# Patient Record
Sex: Female | Born: 1975 | Race: White | Hispanic: No | Marital: Married | Smoking: Never smoker
Health system: Southern US, Community
[De-identification: ages and names within clinical notes are randomized; demographics above are authoritative.]

## PROBLEM LIST (undated history)

## (undated) DIAGNOSIS — I10 Essential (primary) hypertension: Secondary | ICD-10-CM

## (undated) HISTORY — PX: DILATION AND CURETTAGE OF UTERUS: SHX78

---

## 2013-04-23 DIAGNOSIS — J3089 Other allergic rhinitis: Secondary | ICD-10-CM | POA: Insufficient documentation

## 2013-04-23 DIAGNOSIS — F419 Anxiety disorder, unspecified: Secondary | ICD-10-CM | POA: Insufficient documentation

## 2013-04-23 DIAGNOSIS — I341 Nonrheumatic mitral (valve) prolapse: Secondary | ICD-10-CM | POA: Insufficient documentation

## 2013-04-23 DIAGNOSIS — E039 Hypothyroidism, unspecified: Secondary | ICD-10-CM | POA: Insufficient documentation

## 2015-03-15 DIAGNOSIS — J309 Allergic rhinitis, unspecified: Secondary | ICD-10-CM

## 2015-03-15 DIAGNOSIS — E039 Hypothyroidism, unspecified: Secondary | ICD-10-CM

## 2015-03-15 DIAGNOSIS — I341 Nonrheumatic mitral (valve) prolapse: Secondary | ICD-10-CM

## 2015-03-15 DIAGNOSIS — F419 Anxiety disorder, unspecified: Secondary | ICD-10-CM

## 2015-04-06 ENCOUNTER — Ambulatory Visit (INDEPENDENT_AMBULATORY_CARE_PROVIDER_SITE_OTHER): Payer: BC Managed Care – PPO | Admitting: *Deleted

## 2015-04-06 DIAGNOSIS — J309 Allergic rhinitis, unspecified: Secondary | ICD-10-CM

## 2015-04-13 ENCOUNTER — Other Ambulatory Visit: Payer: Self-pay | Admitting: Allergy

## 2015-04-13 MED ORDER — LEVOCETIRIZINE DIHYDROCHLORIDE 5 MG PO TABS: 5.0000 mg | ORAL_TABLET | Freq: Every day | ORAL | Status: DC

## 2015-04-17 ENCOUNTER — Ambulatory Visit (INDEPENDENT_AMBULATORY_CARE_PROVIDER_SITE_OTHER): Payer: BC Managed Care – PPO

## 2015-04-17 DIAGNOSIS — J309 Allergic rhinitis, unspecified: Secondary | ICD-10-CM | POA: Diagnosis not present

## 2015-04-24 ENCOUNTER — Ambulatory Visit (INDEPENDENT_AMBULATORY_CARE_PROVIDER_SITE_OTHER): Payer: BC Managed Care – PPO | Admitting: *Deleted

## 2015-04-24 DIAGNOSIS — J309 Allergic rhinitis, unspecified: Secondary | ICD-10-CM | POA: Diagnosis not present

## 2015-05-04 ENCOUNTER — Ambulatory Visit (INDEPENDENT_AMBULATORY_CARE_PROVIDER_SITE_OTHER): Payer: BC Managed Care – PPO | Admitting: *Deleted

## 2015-05-04 DIAGNOSIS — J309 Allergic rhinitis, unspecified: Secondary | ICD-10-CM

## 2015-05-15 ENCOUNTER — Ambulatory Visit (INDEPENDENT_AMBULATORY_CARE_PROVIDER_SITE_OTHER): Payer: BC Managed Care – PPO

## 2015-05-15 DIAGNOSIS — J309 Allergic rhinitis, unspecified: Secondary | ICD-10-CM | POA: Diagnosis not present

## 2015-05-29 ENCOUNTER — Ambulatory Visit (INDEPENDENT_AMBULATORY_CARE_PROVIDER_SITE_OTHER): Payer: BC Managed Care – PPO

## 2015-05-29 DIAGNOSIS — J309 Allergic rhinitis, unspecified: Secondary | ICD-10-CM

## 2015-06-12 ENCOUNTER — Ambulatory Visit (INDEPENDENT_AMBULATORY_CARE_PROVIDER_SITE_OTHER): Payer: BC Managed Care – PPO

## 2015-06-12 DIAGNOSIS — J309 Allergic rhinitis, unspecified: Secondary | ICD-10-CM | POA: Diagnosis not present

## 2015-06-15 ENCOUNTER — Other Ambulatory Visit: Payer: Self-pay

## 2015-06-15 MED ORDER — LEVOCETIRIZINE DIHYDROCHLORIDE 5 MG PO TABS: 5.0000 mg | ORAL_TABLET | Freq: Every day | ORAL | Status: DC

## 2015-06-15 MED ORDER — MONTELUKAST SODIUM 10 MG PO TABS: 10.0000 mg | ORAL_TABLET | Freq: Every day | ORAL | Status: DC

## 2015-06-20 ENCOUNTER — Other Ambulatory Visit: Payer: Self-pay | Admitting: Internal Medicine

## 2015-06-29 ENCOUNTER — Ambulatory Visit: Payer: Self-pay | Admitting: *Deleted

## 2015-06-29 DIAGNOSIS — J309 Allergic rhinitis, unspecified: Secondary | ICD-10-CM

## 2015-07-06 ENCOUNTER — Ambulatory Visit (INDEPENDENT_AMBULATORY_CARE_PROVIDER_SITE_OTHER): Payer: BC Managed Care – PPO | Admitting: *Deleted

## 2015-07-06 DIAGNOSIS — J309 Allergic rhinitis, unspecified: Secondary | ICD-10-CM | POA: Diagnosis not present

## 2015-07-13 ENCOUNTER — Ambulatory Visit (INDEPENDENT_AMBULATORY_CARE_PROVIDER_SITE_OTHER): Payer: BC Managed Care – PPO | Admitting: *Deleted

## 2015-07-13 DIAGNOSIS — J309 Allergic rhinitis, unspecified: Secondary | ICD-10-CM | POA: Diagnosis not present

## 2015-07-27 ENCOUNTER — Ambulatory Visit (INDEPENDENT_AMBULATORY_CARE_PROVIDER_SITE_OTHER): Payer: BC Managed Care – PPO | Admitting: *Deleted

## 2015-07-27 DIAGNOSIS — J309 Allergic rhinitis, unspecified: Secondary | ICD-10-CM

## 2015-07-27 DIAGNOSIS — J3089 Other allergic rhinitis: Secondary | ICD-10-CM | POA: Diagnosis not present

## 2015-07-28 DIAGNOSIS — J301 Allergic rhinitis due to pollen: Secondary | ICD-10-CM | POA: Diagnosis not present

## 2015-08-14 ENCOUNTER — Ambulatory Visit (INDEPENDENT_AMBULATORY_CARE_PROVIDER_SITE_OTHER): Payer: BC Managed Care – PPO | Admitting: *Deleted

## 2015-08-14 DIAGNOSIS — J309 Allergic rhinitis, unspecified: Secondary | ICD-10-CM

## 2015-08-21 ENCOUNTER — Ambulatory Visit (INDEPENDENT_AMBULATORY_CARE_PROVIDER_SITE_OTHER): Payer: BC Managed Care – PPO

## 2015-08-21 DIAGNOSIS — J309 Allergic rhinitis, unspecified: Secondary | ICD-10-CM | POA: Diagnosis not present

## 2015-09-04 ENCOUNTER — Ambulatory Visit (INDEPENDENT_AMBULATORY_CARE_PROVIDER_SITE_OTHER): Payer: BC Managed Care – PPO

## 2015-09-04 DIAGNOSIS — J309 Allergic rhinitis, unspecified: Secondary | ICD-10-CM | POA: Diagnosis not present

## 2015-09-19 ENCOUNTER — Other Ambulatory Visit: Payer: Self-pay | Admitting: Allergy

## 2015-09-19 MED ORDER — LEVOCETIRIZINE DIHYDROCHLORIDE 5 MG PO TABS: 5.0000 mg | ORAL_TABLET | Freq: Every day | ORAL | Status: DC

## 2015-09-20 ENCOUNTER — Ambulatory Visit (INDEPENDENT_AMBULATORY_CARE_PROVIDER_SITE_OTHER): Payer: BC Managed Care – PPO

## 2015-09-20 DIAGNOSIS — J309 Allergic rhinitis, unspecified: Secondary | ICD-10-CM

## 2015-10-05 ENCOUNTER — Ambulatory Visit (INDEPENDENT_AMBULATORY_CARE_PROVIDER_SITE_OTHER): Payer: BC Managed Care – PPO

## 2015-10-05 DIAGNOSIS — J309 Allergic rhinitis, unspecified: Secondary | ICD-10-CM

## 2015-10-06 ENCOUNTER — Ambulatory Visit: Payer: BC Managed Care – PPO | Admitting: Internal Medicine

## 2015-10-16 ENCOUNTER — Encounter: Payer: Self-pay | Admitting: Internal Medicine

## 2015-10-16 ENCOUNTER — Ambulatory Visit (INDEPENDENT_AMBULATORY_CARE_PROVIDER_SITE_OTHER): Payer: BC Managed Care – PPO | Admitting: Internal Medicine

## 2015-10-16 ENCOUNTER — Ambulatory Visit (INDEPENDENT_AMBULATORY_CARE_PROVIDER_SITE_OTHER): Payer: BC Managed Care – PPO

## 2015-10-16 VITALS — BP 130/84 | HR 80 | Temp 97.7°F | Resp 20 | Ht 63.78 in | Wt 221.6 lb

## 2015-10-16 DIAGNOSIS — J3089 Other allergic rhinitis: Secondary | ICD-10-CM | POA: Diagnosis not present

## 2015-10-16 DIAGNOSIS — J309 Allergic rhinitis, unspecified: Secondary | ICD-10-CM | POA: Diagnosis not present

## 2015-10-16 MED ORDER — KETOTIFEN FUMARATE 0.025 % OP SOLN: 1.0000 [drp] | Freq: Two times a day (BID) | OPHTHALMIC | Status: AC

## 2015-10-16 MED ORDER — FLUNISOLIDE 25 MCG/ACT (0.025%) NA SOLN: 1.0000 | Freq: Every day | NASAL | Status: DC

## 2015-10-16 NOTE — Progress Notes (Signed)
History of Present Illness: Grace Garza is a 40 y.o. female presenting for follow-up  HPI Comments: Allergic rhinitis on immunotherapy: start 06/21/2013. Maintenance reached April 2016. She has not had any shot reactions and is getting injections every 2 weeks. Last year, she had any increase in allergy symptoms of nasal congestion, sneezing, headaches, ocular pruritus. She has been on several rounds of antibiotics for sinus infections with only transient improvement. She denies any other types of infections. She did move to a new home in September. She is using Xyzal 500 daily, Singulair 10 mg daily, Patanase 1 spray nostril twice a day with breakthrough symptoms. In the past, an unidentified nasal spray caused epistaxis.   Current Outpatient Prescriptions on File Prior to Visit  Medication Sig Dispense Refill  . citalopram (CELEXA) 20 MG tablet Take 20 mg by mouth daily.    Marland Kitchen EPINEPHrine (EPIPEN 2-PAK) 0.3 mg/0.3 mL IJ SOAJ injection Inject 0.3 mg into the muscle as needed.     Marland Kitchen levocetirizine (XYZAL) 5 MG tablet Take 1 tablet (5 mg total) by mouth daily. 30 tablet 1  . levothyroxine (SYNTHROID, LEVOTHROID) 88 MCG tablet Take 112 mcg by mouth daily before breakfast.     . montelukast (SINGULAIR) 10 MG tablet Take 1 tablet (10 mg total) by mouth at bedtime. 30 tablet 5   No current facility-administered medications on file prior to visit.    Assessment and Plan: Allergic rhinitis  On immunotherapy, currently not well controlled  Continue Xyzal 5 mg daily, Singulair 10 mg daily  Stop Patanase and start flunisolide 1 spray each nostril daily. Be careful to avoid nasal septum to avoid nasal bleeds   Start ketotifen 1 drop each eye twice a day Given Prednisone 10 mg tablets. Take 1 tablet twice a day for 4 days, then 1 tablet on day #5. If not better, repeat allergy testing this summer to see if she is developing sensitivities and reformulate vaccine as necessary     Return in about 3  months (around 01/15/2016).  Meds ordered this encounter  Medications  . calcium carbonate (OSCAL) 1500 (600 Ca) MG TABS tablet    Sig: Take by mouth daily with breakfast.  . NON FORMULARY    Sig: ALLERGEN IMMUNOTHERAPY  . flunisolide (NASALIDE) 25 MCG/ACT (0.025%) SOLN    Sig: Place 1 spray into the nose daily.    Dispense:  25 mL    Refill:  5    For stuffy nose or drainage  . ketotifen (KP KETOTIFEN FUMARATE) 0.025 % ophthalmic solution    Sig: Place 1 drop into both eyes 2 (two) times daily.    Dispense:  10 mL    Refill:  5    For itchy eyes    Physical Exam: BP 130/84 mmHg  Pulse 80  Temp(Src) 97.7 F (36.5 C) (Oral)  Resp 20  Ht 5' 3.78" (1.62 m)  Wt 221 lb 9.6 oz (100.517 kg)  BMI 38.30 kg/m2   Physical Exam  Constitutional: She appears well-developed and well-nourished. No distress.  HENT:  Right Ear: External ear normal.  Left Ear: External ear normal.  Mouth/Throat: Oropharynx is clear and moist.  Nasal membranes edematous, erythematous with clear drainage  Eyes: Conjunctivae are normal. Right eye exhibits no discharge. Left eye exhibits no discharge.  Cardiovascular: Normal rate, regular rhythm and normal heart sounds.   No murmur heard. Pulmonary/Chest: Effort normal and breath sounds normal. No respiratory distress. She has no wheezes. She has no rales.  Abdominal: Soft.  Bowel sounds are normal.  Musculoskeletal: She exhibits no edema.  Lymphadenopathy:    She has no cervical adenopathy.  Neurological: She is alert.  Skin: No rash noted.  Vitals reviewed.   Drug Allergies:  Allergies  Allergen Reactions  . Avelox [Moxifloxacin] Nausea And Vomiting and Nausea Only    ROS: Per HPI unless specifically indicated below Review of Systems  Thank you for the opportunity to care for this patient.  Please do not hesitate to contact me with questions.

## 2015-10-16 NOTE — Assessment & Plan Note (Signed)
   On immunotherapy, currently not well controlled  Continue Xyzal 5 mg daily, Singulair 10 mg daily  Stop Patanase and start flunisolide 1 spray each nostril daily. Be careful to avoid nasal septum to avoid nasal bleeds   Start ketotifen 1 drop each eye twice a day Given Prednisone 10 mg tablets. Take 1 tablet twice a day for 4 days, then 1 tablet on day #5. If not better, repeat allergy testing this summer to see if she is developing sensitivities and reformulate vaccine as necessary

## 2015-10-16 NOTE — Patient Instructions (Signed)
Allergic rhinitis  On immunotherapy, currently not well controlled  Continue Xyzal 5 mg daily, Singulair 10 mg daily  Stop Patanase and start flunisolide 1 spray each nostril daily. Be careful to avoid nasal septum to avoid nasal bleeds   Start ketotifen 1 drop each eye twice a day Given Prednisone 10 mg tablets. Take 1 tablet twice a day for 4 days, then 1 tablet on day #5. If not better, repeat allergy testing this summer to see if she is developing sensitivities and reformulate vaccine as necessary

## 2015-10-23 ENCOUNTER — Other Ambulatory Visit: Payer: Self-pay

## 2015-10-23 MED ORDER — LEVOCETIRIZINE DIHYDROCHLORIDE 5 MG PO TABS: 5.0000 mg | ORAL_TABLET | Freq: Every day | ORAL | Status: DC

## 2015-11-06 ENCOUNTER — Ambulatory Visit (INDEPENDENT_AMBULATORY_CARE_PROVIDER_SITE_OTHER): Payer: BC Managed Care – PPO

## 2015-11-06 DIAGNOSIS — J309 Allergic rhinitis, unspecified: Secondary | ICD-10-CM

## 2015-11-13 ENCOUNTER — Ambulatory Visit (INDEPENDENT_AMBULATORY_CARE_PROVIDER_SITE_OTHER): Payer: BC Managed Care – PPO

## 2015-11-13 DIAGNOSIS — J309 Allergic rhinitis, unspecified: Secondary | ICD-10-CM | POA: Diagnosis not present

## 2015-11-29 ENCOUNTER — Ambulatory Visit (INDEPENDENT_AMBULATORY_CARE_PROVIDER_SITE_OTHER): Payer: BC Managed Care – PPO

## 2015-11-29 DIAGNOSIS — J309 Allergic rhinitis, unspecified: Secondary | ICD-10-CM | POA: Diagnosis not present

## 2015-12-11 ENCOUNTER — Ambulatory Visit (INDEPENDENT_AMBULATORY_CARE_PROVIDER_SITE_OTHER): Payer: BC Managed Care – PPO

## 2015-12-11 DIAGNOSIS — J309 Allergic rhinitis, unspecified: Secondary | ICD-10-CM | POA: Diagnosis not present

## 2015-12-25 ENCOUNTER — Other Ambulatory Visit: Payer: Self-pay | Admitting: Allergy

## 2015-12-25 MED ORDER — MONTELUKAST SODIUM 10 MG PO TABS: 10.0000 mg | ORAL_TABLET | Freq: Every day | ORAL | Status: DC

## 2015-12-28 ENCOUNTER — Ambulatory Visit (INDEPENDENT_AMBULATORY_CARE_PROVIDER_SITE_OTHER): Payer: BC Managed Care – PPO

## 2015-12-28 DIAGNOSIS — J309 Allergic rhinitis, unspecified: Secondary | ICD-10-CM | POA: Diagnosis not present

## 2016-01-01 DIAGNOSIS — J3089 Other allergic rhinitis: Secondary | ICD-10-CM | POA: Diagnosis not present

## 2016-01-02 DIAGNOSIS — J301 Allergic rhinitis due to pollen: Secondary | ICD-10-CM | POA: Diagnosis not present

## 2016-01-11 ENCOUNTER — Ambulatory Visit (INDEPENDENT_AMBULATORY_CARE_PROVIDER_SITE_OTHER): Payer: BC Managed Care – PPO | Admitting: *Deleted

## 2016-01-11 DIAGNOSIS — J309 Allergic rhinitis, unspecified: Secondary | ICD-10-CM | POA: Diagnosis not present

## 2016-01-25 ENCOUNTER — Ambulatory Visit (INDEPENDENT_AMBULATORY_CARE_PROVIDER_SITE_OTHER): Payer: BC Managed Care – PPO | Admitting: *Deleted

## 2016-01-25 DIAGNOSIS — J309 Allergic rhinitis, unspecified: Secondary | ICD-10-CM

## 2016-02-01 ENCOUNTER — Ambulatory Visit (INDEPENDENT_AMBULATORY_CARE_PROVIDER_SITE_OTHER): Payer: BC Managed Care – PPO

## 2016-02-01 DIAGNOSIS — J309 Allergic rhinitis, unspecified: Secondary | ICD-10-CM

## 2016-02-12 ENCOUNTER — Ambulatory Visit (INDEPENDENT_AMBULATORY_CARE_PROVIDER_SITE_OTHER): Payer: BC Managed Care – PPO

## 2016-02-12 DIAGNOSIS — J309 Allergic rhinitis, unspecified: Secondary | ICD-10-CM

## 2016-02-21 ENCOUNTER — Ambulatory Visit (INDEPENDENT_AMBULATORY_CARE_PROVIDER_SITE_OTHER): Payer: BC Managed Care – PPO

## 2016-02-21 DIAGNOSIS — J309 Allergic rhinitis, unspecified: Secondary | ICD-10-CM

## 2016-02-28 ENCOUNTER — Ambulatory Visit (INDEPENDENT_AMBULATORY_CARE_PROVIDER_SITE_OTHER): Payer: BC Managed Care – PPO

## 2016-02-28 DIAGNOSIS — J309 Allergic rhinitis, unspecified: Secondary | ICD-10-CM | POA: Diagnosis not present

## 2016-03-14 ENCOUNTER — Ambulatory Visit (INDEPENDENT_AMBULATORY_CARE_PROVIDER_SITE_OTHER): Payer: BC Managed Care – PPO

## 2016-03-14 DIAGNOSIS — J309 Allergic rhinitis, unspecified: Secondary | ICD-10-CM | POA: Diagnosis not present

## 2016-03-26 ENCOUNTER — Ambulatory Visit (INDEPENDENT_AMBULATORY_CARE_PROVIDER_SITE_OTHER): Payer: BC Managed Care – PPO

## 2016-03-26 DIAGNOSIS — J309 Allergic rhinitis, unspecified: Secondary | ICD-10-CM | POA: Diagnosis not present

## 2016-04-09 ENCOUNTER — Ambulatory Visit (INDEPENDENT_AMBULATORY_CARE_PROVIDER_SITE_OTHER): Payer: BC Managed Care – PPO

## 2016-04-09 DIAGNOSIS — J309 Allergic rhinitis, unspecified: Secondary | ICD-10-CM | POA: Diagnosis not present

## 2016-04-23 ENCOUNTER — Ambulatory Visit (INDEPENDENT_AMBULATORY_CARE_PROVIDER_SITE_OTHER): Payer: BC Managed Care – PPO

## 2016-04-23 DIAGNOSIS — J309 Allergic rhinitis, unspecified: Secondary | ICD-10-CM | POA: Diagnosis not present

## 2016-04-24 ENCOUNTER — Other Ambulatory Visit: Payer: Self-pay | Admitting: Allergy

## 2016-04-24 MED ORDER — LEVOCETIRIZINE DIHYDROCHLORIDE 5 MG PO TABS: 5.0000 mg | ORAL_TABLET | Freq: Every day | ORAL | 5 refills | Status: DC

## 2016-04-25 DIAGNOSIS — J301 Allergic rhinitis due to pollen: Secondary | ICD-10-CM | POA: Diagnosis not present

## 2016-04-26 DIAGNOSIS — J3089 Other allergic rhinitis: Secondary | ICD-10-CM | POA: Diagnosis not present

## 2016-05-13 ENCOUNTER — Ambulatory Visit (INDEPENDENT_AMBULATORY_CARE_PROVIDER_SITE_OTHER): Payer: BC Managed Care – PPO

## 2016-05-13 DIAGNOSIS — J309 Allergic rhinitis, unspecified: Secondary | ICD-10-CM

## 2016-05-27 ENCOUNTER — Ambulatory Visit (INDEPENDENT_AMBULATORY_CARE_PROVIDER_SITE_OTHER): Payer: BC Managed Care – PPO

## 2016-05-27 DIAGNOSIS — J309 Allergic rhinitis, unspecified: Secondary | ICD-10-CM

## 2016-06-05 ENCOUNTER — Ambulatory Visit (INDEPENDENT_AMBULATORY_CARE_PROVIDER_SITE_OTHER): Payer: BC Managed Care – PPO | Admitting: *Deleted

## 2016-06-05 DIAGNOSIS — J309 Allergic rhinitis, unspecified: Secondary | ICD-10-CM

## 2016-06-12 ENCOUNTER — Ambulatory Visit (INDEPENDENT_AMBULATORY_CARE_PROVIDER_SITE_OTHER): Payer: BC Managed Care – PPO | Admitting: *Deleted

## 2016-06-12 DIAGNOSIS — J309 Allergic rhinitis, unspecified: Secondary | ICD-10-CM | POA: Diagnosis not present

## 2016-06-19 ENCOUNTER — Ambulatory Visit (INDEPENDENT_AMBULATORY_CARE_PROVIDER_SITE_OTHER): Payer: BC Managed Care – PPO | Admitting: *Deleted

## 2016-06-19 DIAGNOSIS — J309 Allergic rhinitis, unspecified: Secondary | ICD-10-CM

## 2016-06-26 ENCOUNTER — Ambulatory Visit (INDEPENDENT_AMBULATORY_CARE_PROVIDER_SITE_OTHER): Payer: BC Managed Care – PPO | Admitting: *Deleted

## 2016-06-26 DIAGNOSIS — J309 Allergic rhinitis, unspecified: Secondary | ICD-10-CM | POA: Diagnosis not present

## 2016-07-11 ENCOUNTER — Ambulatory Visit (INDEPENDENT_AMBULATORY_CARE_PROVIDER_SITE_OTHER): Payer: BC Managed Care – PPO

## 2016-07-11 DIAGNOSIS — J309 Allergic rhinitis, unspecified: Secondary | ICD-10-CM | POA: Diagnosis not present

## 2016-07-23 ENCOUNTER — Other Ambulatory Visit: Payer: Self-pay | Admitting: Allergy

## 2016-07-23 MED ORDER — MONTELUKAST SODIUM 10 MG PO TABS: 10.0000 mg | ORAL_TABLET | Freq: Every day | ORAL | 5 refills | Status: DC

## 2016-07-29 ENCOUNTER — Ambulatory Visit (INDEPENDENT_AMBULATORY_CARE_PROVIDER_SITE_OTHER): Payer: BC Managed Care – PPO

## 2016-07-29 DIAGNOSIS — J309 Allergic rhinitis, unspecified: Secondary | ICD-10-CM | POA: Diagnosis not present

## 2016-08-09 ENCOUNTER — Encounter: Payer: Self-pay | Admitting: *Deleted

## 2016-08-09 NOTE — Progress Notes (Signed)
2 maintenance vials made. Exp: 08/09/17. hc

## 2016-08-16 ENCOUNTER — Ambulatory Visit (INDEPENDENT_AMBULATORY_CARE_PROVIDER_SITE_OTHER): Payer: BC Managed Care – PPO

## 2016-08-16 DIAGNOSIS — J309 Allergic rhinitis, unspecified: Secondary | ICD-10-CM | POA: Diagnosis not present

## 2016-08-28 DIAGNOSIS — J3089 Other allergic rhinitis: Secondary | ICD-10-CM | POA: Diagnosis not present

## 2016-08-29 ENCOUNTER — Ambulatory Visit (INDEPENDENT_AMBULATORY_CARE_PROVIDER_SITE_OTHER): Payer: BC Managed Care – PPO

## 2016-08-29 DIAGNOSIS — J309 Allergic rhinitis, unspecified: Secondary | ICD-10-CM | POA: Diagnosis not present

## 2016-08-30 DIAGNOSIS — J301 Allergic rhinitis due to pollen: Secondary | ICD-10-CM | POA: Diagnosis not present

## 2016-09-10 ENCOUNTER — Ambulatory Visit (INDEPENDENT_AMBULATORY_CARE_PROVIDER_SITE_OTHER): Payer: BC Managed Care – PPO

## 2016-09-10 DIAGNOSIS — J309 Allergic rhinitis, unspecified: Secondary | ICD-10-CM

## 2016-09-10 NOTE — Addendum Note (Signed)
Addended by: Berna BueWHITAKER, CARRIE L on: 09/10/2016 09:38 AM   Modules accepted: Orders

## 2016-09-25 ENCOUNTER — Ambulatory Visit (INDEPENDENT_AMBULATORY_CARE_PROVIDER_SITE_OTHER): Payer: BC Managed Care – PPO

## 2016-09-25 DIAGNOSIS — J309 Allergic rhinitis, unspecified: Secondary | ICD-10-CM

## 2016-10-10 ENCOUNTER — Ambulatory Visit (INDEPENDENT_AMBULATORY_CARE_PROVIDER_SITE_OTHER): Payer: BC Managed Care – PPO

## 2016-10-10 DIAGNOSIS — J309 Allergic rhinitis, unspecified: Secondary | ICD-10-CM | POA: Diagnosis not present

## 2016-10-16 ENCOUNTER — Ambulatory Visit (INDEPENDENT_AMBULATORY_CARE_PROVIDER_SITE_OTHER): Payer: BC Managed Care – PPO

## 2016-10-16 DIAGNOSIS — J309 Allergic rhinitis, unspecified: Secondary | ICD-10-CM | POA: Diagnosis not present

## 2016-10-24 ENCOUNTER — Other Ambulatory Visit: Payer: Self-pay

## 2016-10-25 ENCOUNTER — Ambulatory Visit (INDEPENDENT_AMBULATORY_CARE_PROVIDER_SITE_OTHER): Payer: BC Managed Care – PPO

## 2016-10-25 DIAGNOSIS — J309 Allergic rhinitis, unspecified: Secondary | ICD-10-CM | POA: Diagnosis not present

## 2016-10-31 ENCOUNTER — Ambulatory Visit (INDEPENDENT_AMBULATORY_CARE_PROVIDER_SITE_OTHER): Payer: BC Managed Care – PPO

## 2016-10-31 DIAGNOSIS — J309 Allergic rhinitis, unspecified: Secondary | ICD-10-CM

## 2016-11-07 ENCOUNTER — Ambulatory Visit (INDEPENDENT_AMBULATORY_CARE_PROVIDER_SITE_OTHER): Payer: BC Managed Care – PPO

## 2016-11-07 DIAGNOSIS — J309 Allergic rhinitis, unspecified: Secondary | ICD-10-CM | POA: Diagnosis not present

## 2016-11-20 ENCOUNTER — Other Ambulatory Visit: Payer: Self-pay

## 2016-11-20 NOTE — Telephone Encounter (Signed)
Denied refill xyzal.  Patient needs ov.  Last ov 10/16/15.

## 2016-11-25 ENCOUNTER — Ambulatory Visit (INDEPENDENT_AMBULATORY_CARE_PROVIDER_SITE_OTHER): Payer: BC Managed Care – PPO

## 2016-11-25 DIAGNOSIS — J309 Allergic rhinitis, unspecified: Secondary | ICD-10-CM

## 2016-11-28 ENCOUNTER — Emergency Department (HOSPITAL_BASED_OUTPATIENT_CLINIC_OR_DEPARTMENT_OTHER)
Admission: EM | Admit: 2016-11-28 | Discharge: 2016-11-29 | Disposition: A | Discharge status: Home / Self Care | Payer: BC Managed Care – PPO | Attending: Emergency Medicine | Admitting: Emergency Medicine

## 2016-11-28 ENCOUNTER — Encounter (HOSPITAL_BASED_OUTPATIENT_CLINIC_OR_DEPARTMENT_OTHER): Payer: Self-pay | Admitting: *Deleted

## 2016-11-28 ENCOUNTER — Emergency Department (HOSPITAL_BASED_OUTPATIENT_CLINIC_OR_DEPARTMENT_OTHER): Payer: BC Managed Care – PPO

## 2016-11-28 DIAGNOSIS — Z79899 Other long term (current) drug therapy: Secondary | ICD-10-CM | POA: Diagnosis not present

## 2016-11-28 DIAGNOSIS — K55069 Acute infarction of intestine, part and extent unspecified: Secondary | ICD-10-CM | POA: Insufficient documentation

## 2016-11-28 DIAGNOSIS — I10 Essential (primary) hypertension: Secondary | ICD-10-CM | POA: Insufficient documentation

## 2016-11-28 DIAGNOSIS — R1013 Epigastric pain: Secondary | ICD-10-CM

## 2016-11-28 HISTORY — DX: Essential (primary) hypertension: I10

## 2016-11-28 LAB — CBC WITH DIFFERENTIAL/PLATELET
BASOS PCT: 0 %
Basophils Absolute: 0.1 10*3/uL (ref 0.0–0.1)
EOS ABS: 0.2 10*3/uL (ref 0.0–0.7)
Eosinophils Relative: 1 %
HCT: 39.6 % (ref 36.0–46.0)
HEMOGLOBIN: 13.2 g/dL (ref 12.0–15.0)
LYMPHS ABS: 2.5 10*3/uL (ref 0.7–4.0)
Lymphocytes Relative: 18 %
MCH: 28.5 pg (ref 26.0–34.0)
MCHC: 33.3 g/dL (ref 30.0–36.0)
MCV: 85.5 fL (ref 78.0–100.0)
MONOS PCT: 9 %
Monocytes Absolute: 1.2 10*3/uL — ABNORMAL HIGH (ref 0.1–1.0)
NEUTROS ABS: 10.1 10*3/uL — AB (ref 1.7–7.7)
NEUTROS PCT: 72 %
Platelets: 216 10*3/uL (ref 150–400)
RBC: 4.63 MIL/uL (ref 3.87–5.11)
RDW: 13.4 % (ref 11.5–15.5)
WBC: 14 10*3/uL — ABNORMAL HIGH (ref 4.0–10.5)

## 2016-11-28 LAB — COMPREHENSIVE METABOLIC PANEL
ALBUMIN: 3.7 g/dL (ref 3.5–5.0)
ALT: 17 U/L (ref 14–54)
ANION GAP: 8 (ref 5–15)
AST: 19 U/L (ref 15–41)
Alkaline Phosphatase: 90 U/L (ref 38–126)
BUN: 10 mg/dL (ref 6–20)
CO2: 23 mmol/L (ref 22–32)
Calcium: 8.7 mg/dL — ABNORMAL LOW (ref 8.9–10.3)
Chloride: 103 mmol/L (ref 101–111)
Creatinine, Ser: 1.05 mg/dL — ABNORMAL HIGH (ref 0.44–1.00)
GFR calc non Af Amer: >60 mL/min (ref >60)
GLUCOSE: 98 mg/dL (ref 65–99)
Potassium: 3.9 mmol/L (ref 3.5–5.1)
SODIUM: 134 mmol/L — AB (ref 135–145)
TOTAL PROTEIN: 7.3 g/dL (ref 6.5–8.1)
Total Bilirubin: 0.8 mg/dL (ref 0.3–1.2)

## 2016-11-28 LAB — URINALYSIS, ROUTINE W REFLEX MICROSCOPIC
BILIRUBIN URINE: NEGATIVE
GLUCOSE, UA: NEGATIVE mg/dL
HGB URINE DIPSTICK: NEGATIVE
KETONES UR: NEGATIVE mg/dL
Leukocytes, UA: NEGATIVE
Nitrite: NEGATIVE
PROTEIN: NEGATIVE mg/dL
Specific Gravity, Urine: 1.013 (ref 1.005–1.030)
pH: 7 (ref 5.0–8.0)

## 2016-11-28 LAB — PREGNANCY, URINE: PREG TEST UR: NEGATIVE

## 2016-11-28 LAB — LIPASE, BLOOD: Lipase: 23 U/L (ref 11–51)

## 2016-11-28 MED ORDER — AMOXICILLIN-POT CLAVULANATE 875-125 MG PO TABS
1.0000 | ORAL_TABLET | Freq: Once | ORAL | Status: AC
Start: 2016-11-29 — End: 2016-11-29
  Administered 2016-11-29: 1 via ORAL
  Filled 2016-11-28: qty 1

## 2016-11-28 MED ORDER — PIPERACILLIN-TAZOBACTAM 3.375 G IVPB 30 MIN
3.3750 g | Freq: Once | INTRAVENOUS | Status: DC
  Administered 2016-11-28: 3.375 g via INTRAVENOUS
  Filled 2016-11-28 (×2): qty 50

## 2016-11-28 MED ORDER — SODIUM CHLORIDE 0.9 % IV BOLUS (SEPSIS)
1000.0000 mL | Freq: Once | INTRAVENOUS | Status: AC
  Administered 2016-11-28: 1000 mL via INTRAVENOUS

## 2016-11-28 MED ORDER — GI COCKTAIL ~~LOC~~
30.0000 mL | Freq: Once | ORAL | Status: AC
  Administered 2016-11-28: 30 mL via ORAL
  Filled 2016-11-28: qty 30

## 2016-11-28 MED ORDER — IOPAMIDOL (ISOVUE-300) INJECTION 61%
100.0000 mL | Freq: Once | INTRAVENOUS | Status: AC | PRN
  Administered 2016-11-28: 100 mL via INTRAVENOUS

## 2016-11-28 MED ORDER — VANCOMYCIN HCL IN DEXTROSE 1-5 GM/200ML-% IV SOLN: 1000.0000 mg | Freq: Once | INTRAVENOUS | Status: DC

## 2016-11-28 MED ORDER — TRAMADOL HCL 50 MG PO TABS
50.0000 mg | ORAL_TABLET | Freq: Once | ORAL | Status: AC
  Administered 2016-11-29: 50 mg via ORAL
  Filled 2016-11-28: qty 1

## 2016-11-28 MED ORDER — PANTOPRAZOLE SODIUM 40 MG PO TBEC
40.0000 mg | DELAYED_RELEASE_TABLET | Freq: Once | ORAL | Status: AC
  Administered 2016-11-29: 40 mg via ORAL
  Filled 2016-11-28: qty 1

## 2016-11-28 MED ORDER — SODIUM CHLORIDE 0.9 % IV BOLUS (SEPSIS)
500.0000 mL | Freq: Once | INTRAVENOUS | Status: AC
  Administered 2016-11-28: 500 mL via INTRAVENOUS

## 2016-11-28 NOTE — ED Notes (Signed)
Patient transported to CT 

## 2016-11-28 NOTE — ED Provider Notes (Signed)
[facility]   CSN: [id number] Arrival date & time: 11/28/16  2105  By signing my name below, I, Grace Garza, attest that this documentation has been prepared under the direction and in the presence of Benjiman CorePickering, Maurio Baize, MD. Electronically Signed: Diona BrownerJennifer Garza, ED Scribe. 11/28/16. 9:58 PM.  History   Chief Complaint Chief Complaint  Patient presents with  . Abdominal Pain    HPI Grace [place] LionsLeslie Garza is Garza 41 y.o. female who presents to the Emergency Department complaining of gradually worsening, moderate, abdominal pain that started ~ 2 days ago. Associated sx include fever and nausea. Pt notes talking 2 alka seltzer tabs with mild relief. She was seen by PCP at 3 pm and had labs done and was ordered an US for tomorrow. Pt denies chills and vomiting.  PCP: Grace Garza, Grace Anthony, MD  The history is provided by the patient. No language interpreter was used.    Past Medical History:  Diagnosis Date  . Hypertension     Patient Active Problem List   Diagnosis Date Noted  . Allergic rhinitis 04/23/2013  . Hypothyroidism 04/23/2013  . Anxiety 04/23/2013  . Mitral valve prolapse 04/23/2013    Past Surgical History:  Procedure Laterality Date  . DILATION AND CURETTAGE OF UTERUS      OB History    No data available       Home Medications    Prior to Admission medications   Medication Sig Start Date End Date Taking? Authorizing Provider  lisinopril (PRINIVIL,ZESTRIL) 5 MG tablet Take 5 mg by mouth daily.   Yes [provider]  calcium carbonate (OSCAL) 1500 (600 Ca) MG TABS tablet Take by mouth daily with breakfast.    [provider]  citalopram (CELEXA) 20 MG tablet Take 20 mg by mouth daily. 04/21/13   [provider]  EPINEPHrine (EPIPEN 2-PAK) 0.3 mg/0.3 mL IJ SOAJ injection Inject 0.3 mg into the muscle as needed.  04/23/13   [provider]  flunisolide (NASALIDE) 25 MCG/ACT (0.025%) SOLN Place 1 spray  into the nose daily. 10/16/15   Grace Garza, Sokun, MD  ketotifen (KP KETOTIFEN FUMARATE) 0.025 % ophthalmic solution Place 1 drop into both eyes 2 (two) times daily. 10/16/15   Grace Garza, Sokun, MD  levocetirizine (XYZAL) 5 MG tablet Take 1 tablet (5 mg total) by mouth daily. 04/24/16   Bobbitt, Grace Ilesalph Carter, MD  levothyroxine (SYNTHROID, LEVOTHROID) 88 MCG tablet Take 112 mcg by mouth daily before breakfast.     [provider]  montelukast (SINGULAIR) 10 MG tablet Take 1 tablet (10 mg total) by mouth at bedtime. 07/23/16   Fletcher AnonBardelas, Grace A, MD  NON FORMULARY ALLERGEN IMMUNOTHERAPY    [provider]    Family History History reviewed. No pertinent family history.  Social History Social History  Substance Use Topics  . Smoking status: Unknown If Ever Smoked  . Smokeless tobacco: Never Used  . Alcohol use No     Allergies   Avelox [moxifloxacin]   Review of Systems Review of Systems  Constitutional: Positive for fever. Negative for chills.  Gastrointestinal: Positive for abdominal pain and nausea. Negative for vomiting.     Physical Exam Updated Vital Signs BP 137/66   Pulse 95   Temp 99 F (37.2 C) (Oral)   Resp 16   Ht 5\' 4"  (1.626 m)   Wt 104.3 kg (230 lb)   SpO2 99%   BMI 39.48 kg/m   Physical Exam  Constitutional: She is oriented to person,  place, and time. She appears well-developed and well-nourished.  HENT:  Head: Normocephalic.  Mouth/Throat: Oropharynx is clear and moist.  Eyes: Conjunctivae and EOM are normal. Pupils are equal, round, and reactive to light.  Neck: Normal range of motion.  Cardiovascular: Normal rate, regular rhythm, normal heart sounds and intact distal pulses.   Pulmonary/Chest: Effort normal.  Abdominal: She exhibits no distension. There is tenderness.  Epigastric tenderness. Moderate fullness.  No RUQ tenderness.   Musculoskeletal: Normal range of motion.  Neurological: She is alert and oriented to person, place, and  time. No cranial nerve deficit or sensory deficit. She exhibits normal muscle tone. Coordination normal.  Psychiatric: She has Garza normal mood and affect.  Nursing note and vitals reviewed.    ED Treatments / Results  COORDINATION OF CARE: 9:58 PM-Discussed next steps with pt. Pt verbalized understanding and is agreeable with the plan.  Results for orders placed or performed during the hospital encounter of 11/28/16  Pregnancy, urine  Result Value Ref Range   Preg Test, Ur NEGATIVE NEGATIVE  Urinalysis, Routine w reflex microscopic  Result Value Ref Range   Color, Urine YELLOW YELLOW   APPearance CLEAR CLEAR   Specific Gravity, Urine 1.013 1.005 - 1.030   pH 7.0 5.0 - 8.0   Glucose, UA NEGATIVE NEGATIVE mg/dL   Hgb urine dipstick NEGATIVE NEGATIVE   Bilirubin Urine NEGATIVE NEGATIVE   Ketones, ur NEGATIVE NEGATIVE mg/dL   Protein, ur NEGATIVE NEGATIVE mg/dL   Nitrite NEGATIVE NEGATIVE   Leukocytes, UA NEGATIVE NEGATIVE  Comprehensive metabolic panel  Result Value Ref Range   Sodium 134 (L) 135 - 145 mmol/L   Potassium 3.9 3.5 - 5.1 mmol/L   Chloride 103 101 - 111 mmol/L   CO2 23 22 - 32 mmol/L   Glucose, Bld 98 65 - 99 mg/dL   BUN 10 6 - 20 mg/dL   Creatinine, Ser 1.61 (H) 0.44 - 1.00 mg/dL   Calcium 8.7 (L) 8.9 - 10.3 mg/dL   Total Protein 7.3 6.5 - 8.1 g/dL   Albumin 3.7 3.5 - 5.0 g/dL   AST 19 15 - 41 U/L   ALT 17 14 - 54 U/L   Alkaline Phosphatase 90 38 - 126 U/L   Total Bilirubin 0.8 0.3 - 1.2 mg/dL   GFR calc non Af Amer >60 >60 mL/min   GFR calc Af Amer >60 >60 mL/min   Anion gap 8 5 - 15  Lipase, blood  Result Value Ref Range   Lipase 23 11 - 51 U/L  CBC with Differential  Result Value Ref Range   WBC 14.0 (H) 4.0 - 10.5 K/uL   RBC 4.63 3.87 - 5.11 MIL/uL   Hemoglobin 13.2 12.0 - 15.0 g/dL   HCT 09.6 04.5 - 40.9 %   MCV 85.5 78.0 - 100.0 fL   MCH 28.5 26.0 - 34.0 pg   MCHC 33.3 30.0 - 36.0 g/dL   RDW 81.1 91.4 - 78.2 %   Platelets 216 150 - 400 K/uL    Neutrophils Relative % 72 %   Neutro Abs 10.1 (H) 1.7 - 7.7 K/uL   Lymphocytes Relative 18 %   Lymphs Abs 2.5 0.7 - 4.0 K/uL   Monocytes Relative 9 %   Monocytes Absolute 1.2 (H) 0.1 - 1.0 K/uL   Eosinophils Relative 1 %   Eosinophils Absolute 0.2 0.0 - 0.7 K/uL   Basophils Relative 0 %   Basophils Absolute 0.1 0.0 - 0.1 K/uL   No results  found.  EKG  EKG Interpretation None       Radiology No results found.  Procedures Procedures (including critical care time)  Medications Ordered in ED Medications  gi cocktail (Maalox,Lidocaine,Donnatal) (30 mLs Oral Given 11/28/16 2208)  sodium chloride 0.9 % bolus 500 mL (0 mLs Intravenous Stopped 11/28/16 2308)  iopamidol (ISOVUE-300) 61 % injection 100 mL (100 mLs Intravenous Contrast Given 11/28/16 2258)     Initial Impression / Assessment and Plan / ED Course  I have reviewed the triage vital signs and the nursing notes.  Pertinent labs & imaging results that were available during my care of the patient were reviewed by me and considered in my medical decision making (see chart for details).     Patient with epigastric abdominal pain. Senna for 2 days. Some nausea and vomiting. Labs reassuring. Does not feel better after GI cocktail. CT scan pending. Care turned over to Dr.Palumbo. Final Clinical Impressions(s) / ED Diagnoses   Final diagnoses:  Epigastric pain    New Prescriptions New Prescriptions   No medications on file   I personally performed the services described in this documentation, which was scribed in my presence. The recorded information has been reviewed and is accurate.       Benjiman Core, MD 11/28/16 [phone]

## 2016-11-28 NOTE — ED Notes (Signed)
Pt back from CT

## 2016-11-28 NOTE — ED Notes (Signed)
ED Provider at bedside. 

## 2016-11-28 NOTE — ED Provider Notes (Addendum)
1151 Case d/w Dr. 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  Start oral antibiotics and a proton pump inhibitor BID and if worse in 24 to 48 hours go to the closest ED with surgeon.   Strict clear liquid and medication instructions given to patient with nurse present.    EDP gave strict return precautions to go to the closest hospital with a surgeon if fevers >100.4, worsening pain, intractable vomiting, inability to pass gass or stool, weakness or any concerns.  Patient and husband verbalize understanding and agree to follow up    Grace Fessenden, MD 11/28/16 2354    Grace Brittingham, MD 11/29/16 [id number]

## 2016-11-28 NOTE — ED Triage Notes (Signed)
Pt c/o diffuse abd pain x 2 days , seen by PMD today labs done and US ordered for tomorrow

## 2016-11-29 MED ORDER — AMOXICILLIN-POT CLAVULANATE 875-125 MG PO TABS: 1.0000 | ORAL_TABLET | Freq: Two times a day (BID) | ORAL | 0 refills | Status: DC

## 2016-11-29 MED ORDER — TRAMADOL HCL 50 MG PO TABS: 50.0000 mg | ORAL_TABLET | Freq: Four times a day (QID) | ORAL | 0 refills | Status: DC | PRN

## 2016-11-29 MED ORDER — PANTOPRAZOLE SODIUM 40 MG PO TBEC: 40.0000 mg | DELAYED_RELEASE_TABLET | Freq: Two times a day (BID) | ORAL | 0 refills | Status: DC

## 2016-12-02 ENCOUNTER — Telehealth: Payer: Self-pay | Admitting: Surgery

## 2016-12-02 NOTE — Telephone Encounter (Signed)
41 yo [facility] 11/28/2016.  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  Her pain did better, now a little worse, more on the left side.  ER contacted Dr. Abbey Chattersosenbower - but he did no see her.  She is taking po's okay, no fever.  I discussed coming back to the ER vs continued observation.  She could also call our office tomorrow and be seen later this week or the following week to review how she is doing.  Grace Kinavid Lonney Revak, MD, [facility] Pager: [phone] Office phone:  [phone]

## 2016-12-11 ENCOUNTER — Ambulatory Visit (INDEPENDENT_AMBULATORY_CARE_PROVIDER_SITE_OTHER): Payer: BC Managed Care – PPO

## 2016-12-11 DIAGNOSIS — J309 Allergic rhinitis, unspecified: Secondary | ICD-10-CM

## 2016-12-23 ENCOUNTER — Other Ambulatory Visit: Payer: Self-pay | Admitting: Allergy

## 2016-12-23 MED ORDER — MONTELUKAST SODIUM 10 MG PO TABS: 10.0000 mg | ORAL_TABLET | Freq: Every day | ORAL | 5 refills | Status: DC

## 2016-12-26 ENCOUNTER — Ambulatory Visit (INDEPENDENT_AMBULATORY_CARE_PROVIDER_SITE_OTHER): Payer: BC Managed Care – PPO

## 2016-12-26 DIAGNOSIS — J309 Allergic rhinitis, unspecified: Secondary | ICD-10-CM

## 2016-12-30 ENCOUNTER — Ambulatory Visit (INDEPENDENT_AMBULATORY_CARE_PROVIDER_SITE_OTHER): Payer: BC Managed Care – PPO | Admitting: Allergy and Immunology

## 2016-12-30 ENCOUNTER — Encounter: Payer: Self-pay | Admitting: Allergy and Immunology

## 2016-12-30 VITALS — BP 120/76 | HR 68 | Temp 97.9°F | Resp 20

## 2016-12-30 DIAGNOSIS — J3089 Other allergic rhinitis: Secondary | ICD-10-CM | POA: Diagnosis not present

## 2016-12-30 MED ORDER — MONTELUKAST SODIUM 10 MG PO TABS: 10.0000 mg | ORAL_TABLET | Freq: Every day | ORAL | 3 refills | Status: DC

## 2016-12-30 MED ORDER — FLUNISOLIDE 25 MCG/ACT (0.025%) NA SOLN: 1.0000 | Freq: Every day | NASAL | 3 refills | Status: AC

## 2016-12-30 MED ORDER — LEVOCETIRIZINE DIHYDROCHLORIDE 5 MG PO TABS: 5.0000 mg | ORAL_TABLET | Freq: Every day | ORAL | 3 refills | Status: AC

## 2016-12-30 NOTE — Progress Notes (Signed)
Follow-up Note  RE: Grace Garza MRN: [id number] DOB: September 09, 1975 Date of Office Visit: 12/30/2016  Primary care provider: Venetia Constable, MD Referring provider: Hollace Kinnier*  History of present illness: Grace Garza is a 41 y.o. female with allergic rhinoconjunctivitis on immunotherapy presents today for follow up.  She was last seen in this clinic in April 2017 by Dr. Clydie Braun, who has since left the practice.  She is currently receiving aeroallergen immunotherapy injections every 2 weeks without problems or complications.  She reports that her symptoms have improved overall, however she is still requiring levocetirizine and Patanase nasal spray on a daily basis.  She ran out of montelukast approximately 2 weeks ago and has not noticed a significant change in her symptoms.   Assessment and plan: Allergic rhinitis  Continue appropriate allergen avoidance measures, immunotherapy, levocetirizine (Xyzal) 5 mg daily as needed, and Patanase, one spray per nostril 1-2 times daily as needed.  I have also recommended nasal saline spray (i.e. Simply Saline) as needed and prior to medicated nasal sprays.  Consider switching to fexofenadine if benefit from levocetirizine seems to diminish with daily use. If unable to decrease medications without symptom recurrence, consider repeating allergy testing to see if she has developed new sensitivities and reformulate vaccine as necessary    Meds ordered this encounter  Medications  . levocetirizine (XYZAL) 5 MG tablet    Sig: Take 1 tablet (5 mg total) by mouth daily.    Dispense:  90 tablet    Refill:  3  . montelukast (SINGULAIR) 10 MG tablet    Sig: Take 1 tablet (10 mg total) by mouth at bedtime.    Dispense:  90 tablet    Refill:  3  . flunisolide (NASALIDE) 25 MCG/ACT (0.025%) SOLN    Sig: Place 1 spray into the nose daily.    Dispense:  75 mL    Refill:  3    For stuffy nose or drainage    Physical  examination: Blood pressure 120/76, pulse 68, temperature 97.9 F (36.6 C), temperature source Oral, resp. rate 20, SpO2 97 %.  General: Alert, interactive, in no acute distress. HEENT: TMs pearly gray, turbinates moderately edematous without discharge, post-pharynx mildly erythematous. Neck: Supple without lymphadenopathy. Lungs: Clear to auscultation without wheezing, rhonchi or rales. CV: Normal S1, S2 without murmurs. Skin: Warm and dry, without lesions or rashes.  The following portions of the patient's history were reviewed and updated as appropriate: allergies, current medications, past family history, past medical history, past social history, past surgical history and problem list.  Allergies as of 12/30/2016      Reactions   Avelox [moxifloxacin] Nausea And Vomiting, Nausea Only      Medication List       Accurate as of 12/30/16  1:49 PM. Always use your most recent med list.          amoxicillin-clavulanate 875-125 MG tablet Commonly known as:  AUGMENTIN Take 1 tablet by mouth 2 (two) times daily. One po bid x 7 days   calcium carbonate 1500 (600 Ca) MG Tabs tablet Commonly known as:  OSCAL Take by mouth daily with breakfast.   citalopram 20 MG tablet Commonly known as:  CELEXA Take 20 mg by mouth daily.   EPIPEN 2-PAK 0.3 mg/0.3 mL Soaj injection Generic drug:  EPINEPHrine Inject 0.3 mg into the muscle as needed.   flunisolide 25 MCG/ACT (0.025%) Soln Commonly known as:  NASALIDE Place 1 spray into the nose daily.  ketotifen 0.025 % ophthalmic solution Commonly known as:  KP KETOTIFEN FUMARATE Place 1 drop into both eyes 2 (two) times daily.   levocetirizine 5 MG tablet Commonly known as:  XYZAL Take 1 tablet (5 mg total) by mouth daily.   levothyroxine 88 MCG tablet Commonly known as:  SYNTHROID, LEVOTHROID Take 112 mcg by mouth daily before breakfast.   lisinopril 5 MG tablet Commonly known as:  PRINIVIL,ZESTRIL Take 5 mg by mouth daily.    montelukast 10 MG tablet Commonly known as:  SINGULAIR Take 1 tablet (10 mg total) by mouth at bedtime.   NON FORMULARY ALLERGEN IMMUNOTHERAPY   pantoprazole 40 MG tablet Commonly known as:  PROTONIX Take 1 tablet (40 mg total) by mouth 2 (two) times daily.   traMADol 50 MG tablet Commonly known as:  ULTRAM Take 1 tablet (50 mg total) by mouth every 6 (six) hours as needed.       Allergies  Allergen Reactions  . Avelox [Moxifloxacin] Nausea And Vomiting and Nausea Only    I appreciate the opportunity to take part in Jakeya's care. Please do not hesitate to contact me with questions.  Sincerely,   R. Jorene Guestarter Nocole Zammit, MD

## 2016-12-30 NOTE — Assessment & Plan Note (Addendum)
   Continue appropriate allergen avoidance measures, immunotherapy, levocetirizine (Xyzal) 5 mg daily as needed, and Patanase, one spray per nostril 1-2 times daily as needed.  I have also recommended nasal saline spray (i.e. Simply Saline) as needed and prior to medicated nasal sprays.  Consider switching to fexofenadine if benefit from levocetirizine seems to diminish with daily use. If unable to decrease medications without symptom recurrence, consider repeating allergy testing to see if she has developed new sensitivities and reformulate vaccine as necessary

## 2016-12-30 NOTE — Patient Instructions (Addendum)
Allergic rhinitis  Continue appropriate allergen avoidance measures, immunotherapy, levocetirizine (Xyzal) 5 mg daily as needed, and Patanase, one spray per nostril 1-2 times daily as needed.  I have also recommended nasal saline spray (i.e. Simply Saline) as needed and prior to medicated nasal sprays.  Consider switching to fexofenadine if benefit from levocetirizine seems to diminish with daily use. If unable to decrease medications without symptom recurrence, consider repeating allergy testing to see if she has developed new sensitivities and reformulate vaccine as necessary    Return in about 1 year (around 12/30/2017), or if symptoms worsen or fail to improve.

## 2017-01-07 ENCOUNTER — Ambulatory Visit (INDEPENDENT_AMBULATORY_CARE_PROVIDER_SITE_OTHER): Payer: BC Managed Care – PPO | Admitting: *Deleted

## 2017-01-07 DIAGNOSIS — J309 Allergic rhinitis, unspecified: Secondary | ICD-10-CM | POA: Diagnosis not present

## 2017-01-17 DIAGNOSIS — J301 Allergic rhinitis due to pollen: Secondary | ICD-10-CM | POA: Diagnosis not present

## 2017-01-30 ENCOUNTER — Ambulatory Visit (INDEPENDENT_AMBULATORY_CARE_PROVIDER_SITE_OTHER): Payer: BC Managed Care – PPO

## 2017-01-30 DIAGNOSIS — J309 Allergic rhinitis, unspecified: Secondary | ICD-10-CM | POA: Diagnosis not present

## 2017-02-17 ENCOUNTER — Ambulatory Visit (INDEPENDENT_AMBULATORY_CARE_PROVIDER_SITE_OTHER): Payer: BC Managed Care – PPO

## 2017-02-17 DIAGNOSIS — J309 Allergic rhinitis, unspecified: Secondary | ICD-10-CM | POA: Diagnosis not present

## 2017-03-06 ENCOUNTER — Ambulatory Visit (INDEPENDENT_AMBULATORY_CARE_PROVIDER_SITE_OTHER): Payer: BC Managed Care – PPO

## 2017-03-06 DIAGNOSIS — J309 Allergic rhinitis, unspecified: Secondary | ICD-10-CM | POA: Diagnosis not present

## 2017-03-14 ENCOUNTER — Ambulatory Visit (INDEPENDENT_AMBULATORY_CARE_PROVIDER_SITE_OTHER): Payer: BC Managed Care – PPO

## 2017-03-14 DIAGNOSIS — J309 Allergic rhinitis, unspecified: Secondary | ICD-10-CM

## 2017-03-20 ENCOUNTER — Ambulatory Visit (INDEPENDENT_AMBULATORY_CARE_PROVIDER_SITE_OTHER): Payer: BC Managed Care – PPO | Admitting: *Deleted

## 2017-03-20 DIAGNOSIS — J309 Allergic rhinitis, unspecified: Secondary | ICD-10-CM

## 2017-03-25 ENCOUNTER — Ambulatory Visit (INDEPENDENT_AMBULATORY_CARE_PROVIDER_SITE_OTHER): Payer: BC Managed Care – PPO

## 2017-03-25 DIAGNOSIS — J309 Allergic rhinitis, unspecified: Secondary | ICD-10-CM

## 2017-04-14 ENCOUNTER — Ambulatory Visit (INDEPENDENT_AMBULATORY_CARE_PROVIDER_SITE_OTHER): Payer: BC Managed Care – PPO

## 2017-04-14 DIAGNOSIS — J309 Allergic rhinitis, unspecified: Secondary | ICD-10-CM

## 2017-05-01 ENCOUNTER — Ambulatory Visit (INDEPENDENT_AMBULATORY_CARE_PROVIDER_SITE_OTHER): Payer: BC Managed Care – PPO

## 2017-05-01 DIAGNOSIS — J309 Allergic rhinitis, unspecified: Secondary | ICD-10-CM

## 2017-05-14 ENCOUNTER — Ambulatory Visit (INDEPENDENT_AMBULATORY_CARE_PROVIDER_SITE_OTHER): Payer: BC Managed Care – PPO

## 2017-05-14 DIAGNOSIS — J309 Allergic rhinitis, unspecified: Secondary | ICD-10-CM | POA: Diagnosis not present

## 2017-05-27 ENCOUNTER — Encounter: Payer: Self-pay | Admitting: *Deleted

## 2017-05-27 NOTE — Progress Notes (Signed)
MT VIALS MADE 

## 2017-05-28 ENCOUNTER — Ambulatory Visit (INDEPENDENT_AMBULATORY_CARE_PROVIDER_SITE_OTHER): Payer: BC Managed Care – PPO

## 2017-05-28 DIAGNOSIS — J309 Allergic rhinitis, unspecified: Secondary | ICD-10-CM | POA: Diagnosis not present

## 2017-06-02 DIAGNOSIS — J3089 Other allergic rhinitis: Secondary | ICD-10-CM | POA: Diagnosis not present

## 2017-06-19 ENCOUNTER — Ambulatory Visit (INDEPENDENT_AMBULATORY_CARE_PROVIDER_SITE_OTHER): Payer: BC Managed Care – PPO

## 2017-06-19 DIAGNOSIS — J309 Allergic rhinitis, unspecified: Secondary | ICD-10-CM

## 2017-07-22 ENCOUNTER — Ambulatory Visit (INDEPENDENT_AMBULATORY_CARE_PROVIDER_SITE_OTHER): Payer: BC Managed Care – PPO

## 2017-07-22 DIAGNOSIS — J309 Allergic rhinitis, unspecified: Secondary | ICD-10-CM

## 2017-08-01 ENCOUNTER — Ambulatory Visit (INDEPENDENT_AMBULATORY_CARE_PROVIDER_SITE_OTHER): Payer: BC Managed Care – PPO

## 2017-08-01 DIAGNOSIS — J309 Allergic rhinitis, unspecified: Secondary | ICD-10-CM

## 2017-08-06 ENCOUNTER — Ambulatory Visit (INDEPENDENT_AMBULATORY_CARE_PROVIDER_SITE_OTHER): Payer: BC Managed Care – PPO

## 2017-08-06 DIAGNOSIS — J309 Allergic rhinitis, unspecified: Secondary | ICD-10-CM | POA: Diagnosis not present

## 2017-08-13 ENCOUNTER — Ambulatory Visit (INDEPENDENT_AMBULATORY_CARE_PROVIDER_SITE_OTHER): Payer: BC Managed Care – PPO

## 2017-08-13 DIAGNOSIS — J309 Allergic rhinitis, unspecified: Secondary | ICD-10-CM | POA: Diagnosis not present

## 2017-08-29 ENCOUNTER — Ambulatory Visit (INDEPENDENT_AMBULATORY_CARE_PROVIDER_SITE_OTHER): Payer: BC Managed Care – PPO | Admitting: *Deleted

## 2017-08-29 DIAGNOSIS — J309 Allergic rhinitis, unspecified: Secondary | ICD-10-CM | POA: Diagnosis not present

## 2017-09-04 ENCOUNTER — Ambulatory Visit (INDEPENDENT_AMBULATORY_CARE_PROVIDER_SITE_OTHER): Payer: BC Managed Care – PPO

## 2017-09-04 DIAGNOSIS — J309 Allergic rhinitis, unspecified: Secondary | ICD-10-CM

## 2017-09-18 ENCOUNTER — Ambulatory Visit (INDEPENDENT_AMBULATORY_CARE_PROVIDER_SITE_OTHER): Payer: BC Managed Care – PPO | Admitting: *Deleted

## 2017-09-18 DIAGNOSIS — J309 Allergic rhinitis, unspecified: Secondary | ICD-10-CM | POA: Diagnosis not present

## 2017-10-09 ENCOUNTER — Ambulatory Visit (INDEPENDENT_AMBULATORY_CARE_PROVIDER_SITE_OTHER): Payer: BC Managed Care – PPO

## 2017-10-09 DIAGNOSIS — J309 Allergic rhinitis, unspecified: Secondary | ICD-10-CM

## 2017-10-29 ENCOUNTER — Ambulatory Visit (INDEPENDENT_AMBULATORY_CARE_PROVIDER_SITE_OTHER): Payer: BC Managed Care – PPO | Admitting: *Deleted

## 2017-10-29 DIAGNOSIS — J309 Allergic rhinitis, unspecified: Secondary | ICD-10-CM | POA: Diagnosis not present

## 2017-11-10 DIAGNOSIS — J301 Allergic rhinitis due to pollen: Secondary | ICD-10-CM | POA: Diagnosis not present

## 2017-11-10 NOTE — Progress Notes (Signed)
VIALS EXP 11-11-18 

## 2017-11-18 ENCOUNTER — Ambulatory Visit (INDEPENDENT_AMBULATORY_CARE_PROVIDER_SITE_OTHER): Payer: BC Managed Care – PPO

## 2017-11-18 DIAGNOSIS — J309 Allergic rhinitis, unspecified: Secondary | ICD-10-CM

## 2017-12-10 ENCOUNTER — Ambulatory Visit (INDEPENDENT_AMBULATORY_CARE_PROVIDER_SITE_OTHER): Payer: BC Managed Care – PPO | Admitting: *Deleted

## 2017-12-10 DIAGNOSIS — J309 Allergic rhinitis, unspecified: Secondary | ICD-10-CM | POA: Diagnosis not present

## 2017-12-19 ENCOUNTER — Ambulatory Visit (INDEPENDENT_AMBULATORY_CARE_PROVIDER_SITE_OTHER): Payer: BC Managed Care – PPO

## 2017-12-19 DIAGNOSIS — J309 Allergic rhinitis, unspecified: Secondary | ICD-10-CM | POA: Diagnosis not present

## 2017-12-25 ENCOUNTER — Ambulatory Visit (INDEPENDENT_AMBULATORY_CARE_PROVIDER_SITE_OTHER): Payer: BC Managed Care – PPO

## 2017-12-25 DIAGNOSIS — J309 Allergic rhinitis, unspecified: Secondary | ICD-10-CM

## 2017-12-31 ENCOUNTER — Encounter: Payer: Self-pay | Admitting: Allergy and Immunology

## 2017-12-31 ENCOUNTER — Ambulatory Visit: Payer: BC Managed Care – PPO | Admitting: Allergy and Immunology

## 2017-12-31 VITALS — BP 116/90 | HR 81 | Temp 98.1°F | Resp 16 | Ht 64.0 in | Wt 227.3 lb

## 2017-12-31 DIAGNOSIS — J309 Allergic rhinitis, unspecified: Secondary | ICD-10-CM | POA: Diagnosis not present

## 2017-12-31 DIAGNOSIS — H1013 Acute atopic conjunctivitis, bilateral: Secondary | ICD-10-CM | POA: Diagnosis not present

## 2017-12-31 DIAGNOSIS — J3089 Other allergic rhinitis: Secondary | ICD-10-CM | POA: Diagnosis not present

## 2017-12-31 DIAGNOSIS — H1010 Acute atopic conjunctivitis, unspecified eye: Secondary | ICD-10-CM | POA: Insufficient documentation

## 2017-12-31 MED ORDER — EPINEPHRINE 0.3 MG/0.3ML IJ SOAJ: INTRAMUSCULAR | 1 refills | Status: AC

## 2017-12-31 NOTE — Assessment & Plan Note (Signed)
   Treatment plan as outlined above for allergic rhinitis.  Continue ketotifen eye drops twice daily if needed.  Eye lubricant drops (i.e., Natural Tears) if needed.

## 2017-12-31 NOTE — Assessment & Plan Note (Signed)
   Continue appropriate allergen avoidance measures, immunotherapy as prescribed, levocetirizine (Xyzal) as needed, and Patanase, if needed.  Nasal saline spray (i.e. Simply Saline) is recommended prior to medicated nasal sprays and as needed. If unable to decrease/discontinue medications without symptom recurrence, consider repeating allergy testing and reformulating vaccine if necessary

## 2017-12-31 NOTE — Patient Instructions (Addendum)
Allergic rhinitis  Continue appropriate allergen avoidance measures, immunotherapy as prescribed, levocetirizine (Xyzal) as needed, and Patanase, if needed.  Nasal saline spray (i.e. Simply Saline) is recommended prior to medicated nasal sprays and as needed. If unable to decrease/discontinue medications without symptom recurrence, consider repeating allergy testing and reformulating vaccine if necessary   Allergic conjunctivitis  Treatment plan as outlined above for allergic rhinitis.  Continue ketotifen eye drops twice daily if needed.  Eye lubricant drops (i.e., Natural Tears) if needed.   Return in about 1 year (around 01/01/2019), or if symptoms worsen or fail to improve.

## 2017-12-31 NOTE — Progress Notes (Signed)
Follow-up Note  RE: Grace Garza MRN: [id number] DOB: 1975-07-31 Date of Office Visit: 12/31/2017  Primary care provider: Venetia ConstableAlexander, Harry Anthony, MD Referring provider: Hollace KinnierAlexander, Harry Anthon*  History of present illness: Grace Garza is a 42 y.o. female with allergic rhinitis on immunotherapy presenting today for follow-up. She was last seen in this clinic in June 2018. She is receiving immunotherapy injections without problems or complications.  However, she currently still requires 2.5 mg of levocetirizine every other day and occasional allergy eyedrops.  She reports that this spring was "pretty rough with pollen."  She has been on immunotherapy since December 2014.  Assessment and plan: Allergic rhinitis  Continue appropriate allergen avoidance measures, immunotherapy as prescribed, levocetirizine (Xyzal) as needed, and Patanase, if needed.  Nasal saline spray (i.e. Simply Saline) is recommended prior to medicated nasal sprays and as needed. If unable to decrease/discontinue medications without symptom recurrence, consider repeating allergy testing and reformulating vaccine if necessary   Allergic conjunctivitis  Treatment plan as outlined above for allergic rhinitis.  Continue ketotifen eye drops twice daily if needed.  Eye lubricant drops (i.e., Natural Tears) if needed.   Meds ordered this encounter  Medications  . EPINEPHrine (AUVI-Q) 0.3 mg/0.3 mL IJ SOAJ injection    Sig: Use as directed for severe allergic reaction    Dispense:  2 Device    Refill:  1    Physical examination: Blood pressure 116/90, pulse 81, temperature 98.1 F (36.7 C), temperature source Oral, resp. rate 16, height 5\' 4"  (1.626 m), weight 227 lb 4.7 oz (103.1 kg).  General: Alert, interactive, in no acute distress. HEENT: TMs pearly gray, turbinates mildly edematous without discharge, post-pharynx mildly erythematous. Neck: Supple without lymphadenopathy. Lungs: Clear to auscultation  without wheezing, rhonchi or rales. CV: Normal S1, S2 without murmurs. Skin: Warm and dry, without lesions or rashes.  The following portions of the patient's history were reviewed and updated as appropriate: allergies, current medications, past family history, past medical history, past social history, past surgical history and problem list.  Allergies as of 12/31/2017      Reactions   Avelox [moxifloxacin] Nausea And Vomiting, Nausea Only      Medication List        Accurate as of 12/31/17  1:09 PM. Always use your most recent med list.          calcium carbonate 1500 (600 Ca) MG Tabs tablet Commonly known as:  OSCAL Take by mouth daily with breakfast.   citalopram 20 MG tablet Commonly known as:  CELEXA Take 20 mg by mouth daily.   EPIPEN 2-PAK 0.3 mg/0.3 mL Soaj injection Generic drug:  EPINEPHrine Inject 0.3 mg into the muscle as needed.   EPINEPHrine 0.3 mg/0.3 mL Soaj injection Commonly known as:  AUVI-Q Use as directed for severe allergic reaction   flunisolide 25 MCG/ACT (0.025%) Soln Commonly known as:  NASALIDE Place 1 spray into the nose daily.   ketotifen 0.025 % ophthalmic solution Commonly known as:  KP KETOTIFEN FUMARATE Place 1 drop into both eyes 2 (two) times daily.   levocetirizine 5 MG tablet Commonly known as:  XYZAL Take 1 tablet (5 mg total) by mouth daily.   levothyroxine 112 MCG tablet Commonly known as:  SYNTHROID, LEVOTHROID Take 112 mcg by mouth daily before breakfast.   lisinopril 5 MG tablet Commonly known as:  PRINIVIL,ZESTRIL Take 5 mg by mouth daily.   NON FORMULARY ALLERGEN IMMUNOTHERAPY       Allergies  Allergen Reactions  .  Avelox [Moxifloxacin] Nausea And Vomiting and Nausea Only    I appreciate the opportunity to take part in Hina's care. Please do not hesitate to contact me with questions.  Sincerely,   R. Jorene Guest, MD

## 2018-01-03 IMAGING — CT CT ABD-PELV W/ CM
2 of 5 series · 15 of 46 positions shown, 17 images · IV contrast (APPLIED)
Comparison: None.

CLINICAL DATA: Epigastric pain with nausea and fever

EXAM:
CT ABDOMEN AND PELVIS WITH CONTRAST
TECHNIQUE: Multidetector CT imaging of the abdomen and pelvis was performed
using the standard protocol following bolus administration of
intravenous contrast.
CONTRAST:  100mL BAN95J-SGG IOPAMIDOL (BAN95J-SGG) INJECTION 61%

[Series 2: axial st · axial · 0.98mm/px · z∈[+608,+1063]mm · 12 of 103 slices shown, 14 images]
[im 6/103  soft-tissue]
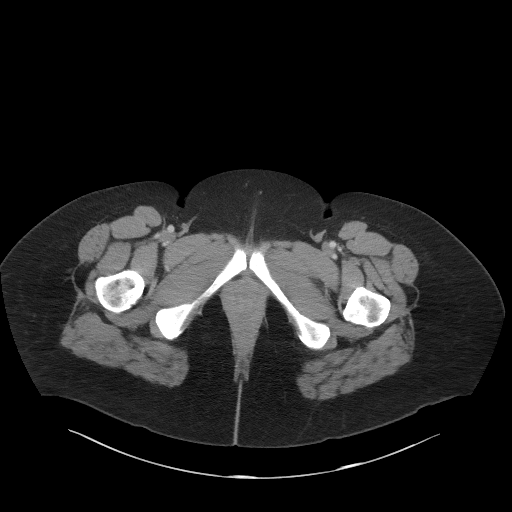
[im 6/103  bone]
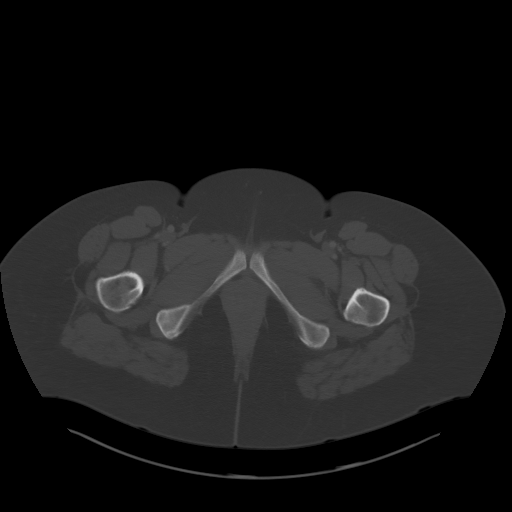
[im 18/103  soft-tissue]
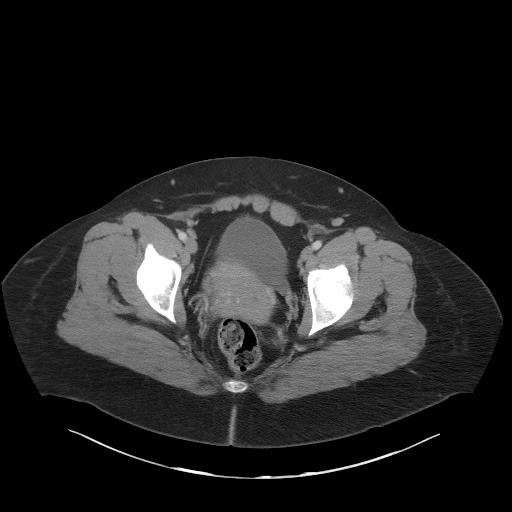
[im 23/103  soft-tissue]
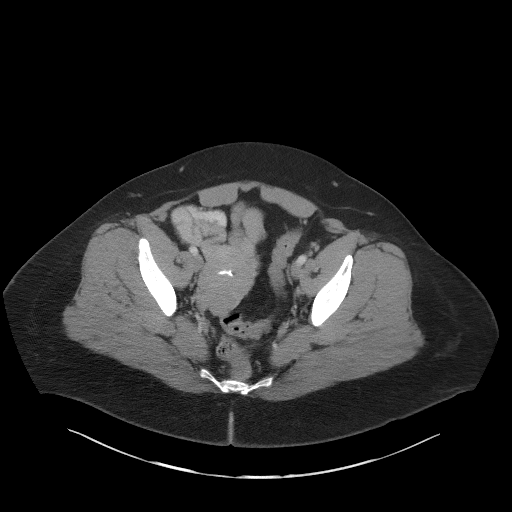
[im 29/103  soft-tissue]
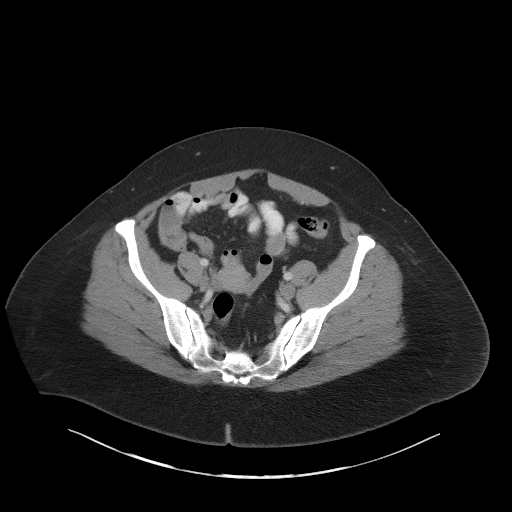
[im 40/103  soft-tissue]
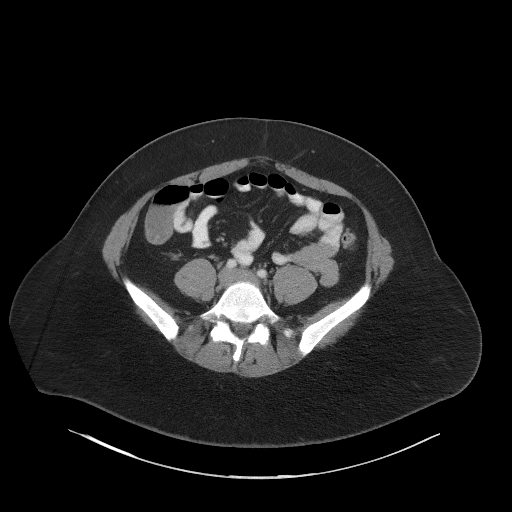
[im 46/103  soft-tissue]
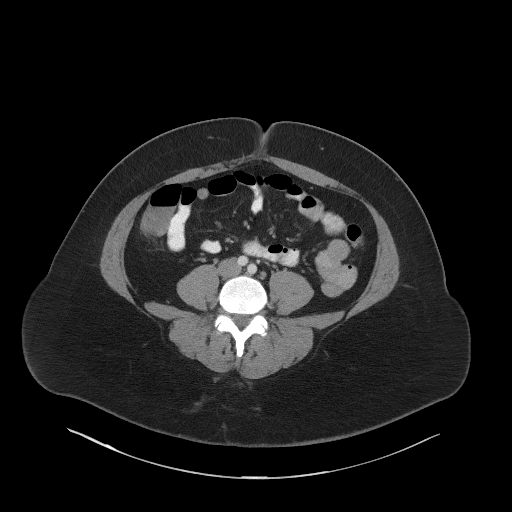
[im 57/103  soft-tissue]
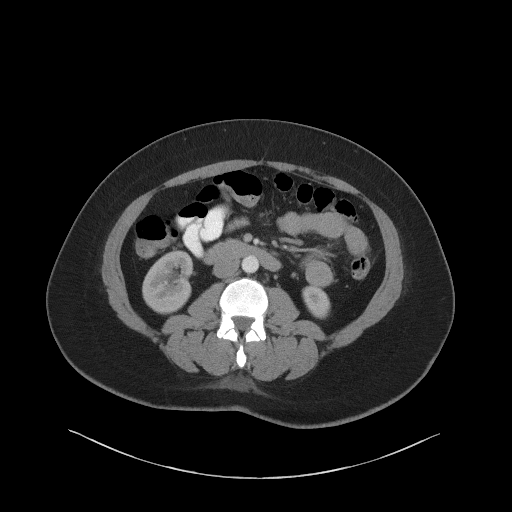
[im 63/103  soft-tissue]
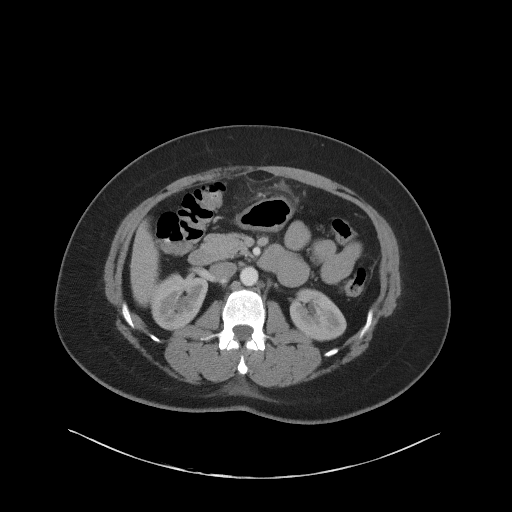
[im 74/103  soft-tissue]
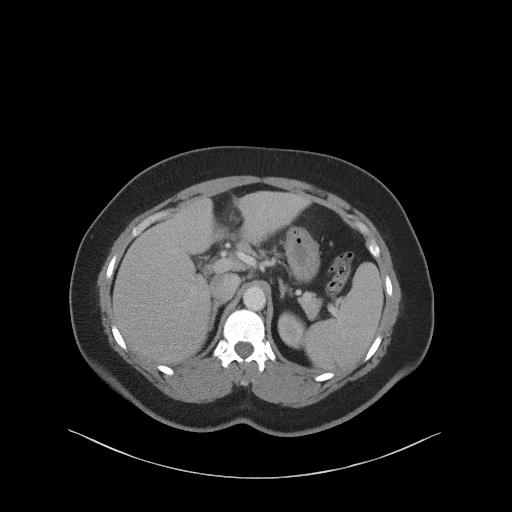
[im 74/103  bone]
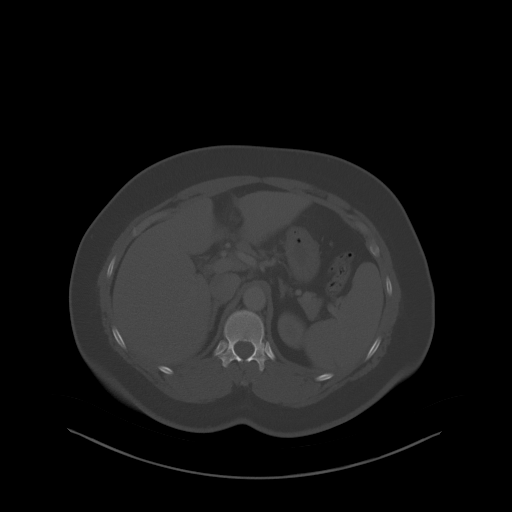
[im 80/103  soft-tissue]
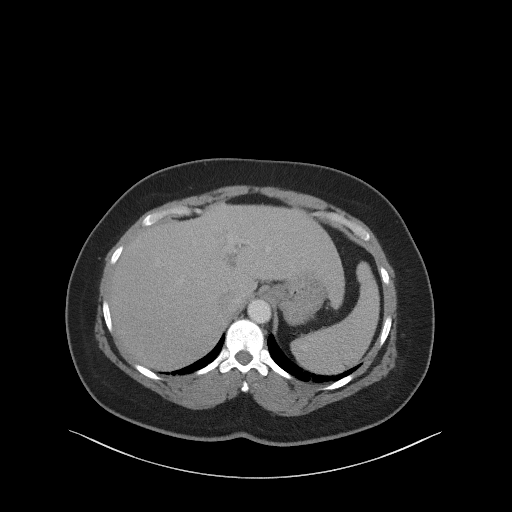
[im 86/103  soft-tissue]
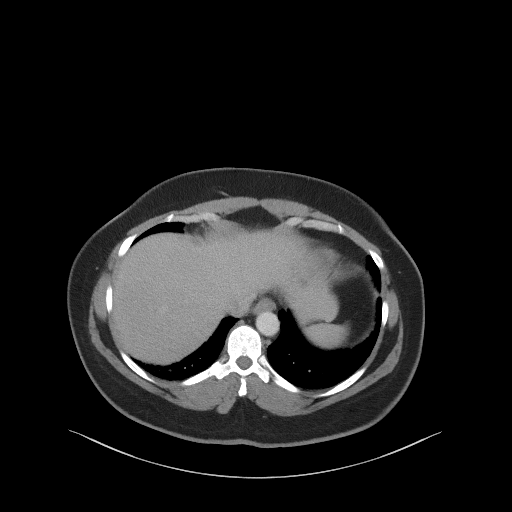
[im 97/103  soft-tissue]
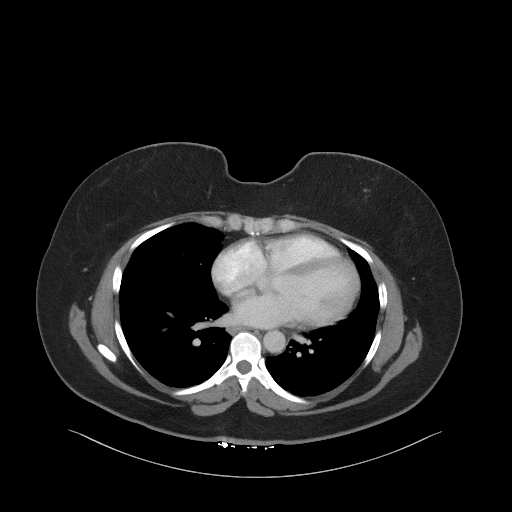

[Series 5: coronal st · coronal · 0.93mm/px · 3 of 88 slices shown]
[im 30/88  soft-tissue]
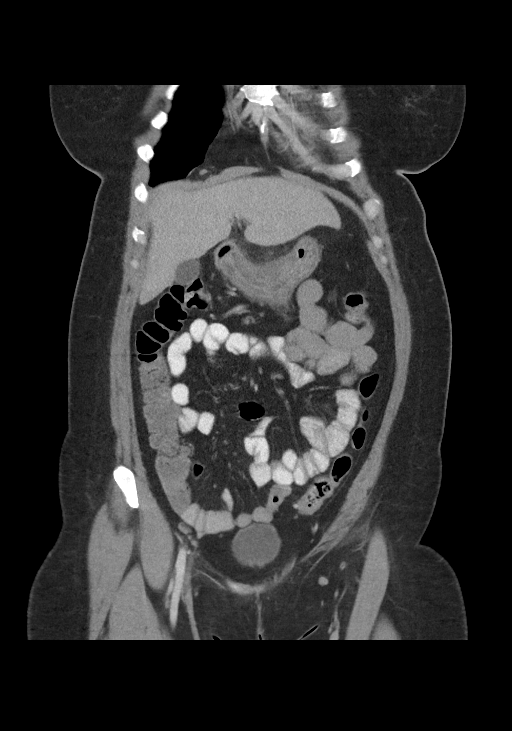
[im 39/88  soft-tissue]
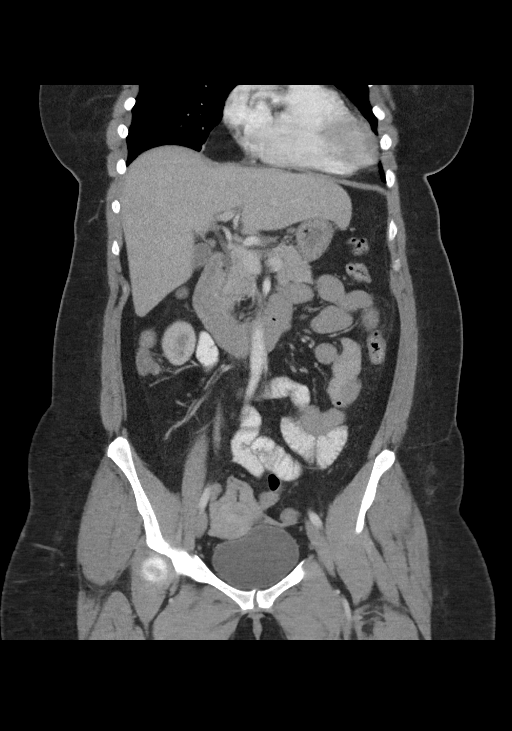
[im 49/88  soft-tissue]
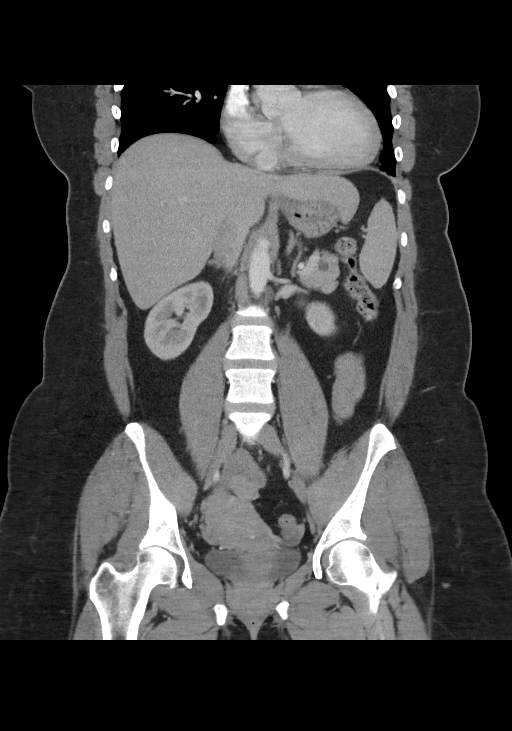

[15 of 46 positions shown; findings below may reference images not displayed]

FINDINGS: Lower chest: Lung bases demonstrate no acute consolidation or
pleural effusion. Normal heart size.

Hepatobiliary: No focal liver abnormality is seen. No gallstones,
gallbladder wall thickening, or biliary dilatation.

Pancreas: No surrounding inflammation. 11 mm low-density lesion in
the tail of the pancreas.

Spleen: Normal in size without focal abnormality.

Adrenals/Urinary Tract: Adrenal glands are within normal limits.
Kidneys show no hydronephrosis or focal abnormality. Bladder is
normal.

Stomach/Bowel: Stomach is nonenlarged. No evidence for bowel
obstruction. No colon wall thickening

Vascular/Lymphatic: Non aneurysmal aorta.  No enlarged lymph nodes.

Reproductive: Intrauterine device present.  No adnexal masses.

Other: Trace free fluid in the pelvis.  No free air.

Soft tissue inflammation and edema within the anterior mesenteric
fat, anterior to the distal stomach.

Musculoskeletal: No acute or suspicious bone lesion
IMPRESSION: 1. Inflammatory process within the omental/anterior mesenteric
anterior to the distal stomach. No extraluminal gas collection to
suggest perforated gastric ulcer. Findings could be secondary to
atypical appearance of omental infarct, or nonspecific mesenteric
panniculitis. No free air.
2. 11 mm cystic density in the tail of the pancreas. Recommend
follow up pre and post contrast MRI/MRCP or pancreatic protocol CT
in 1 year. This recommendation follows ACR consensus guidelines:
Management of Incidental Pancreatic Cysts: A White Paper of the ACR
Incidental Findings Committee. [HOSPITAL] [id number];[DATE].
